# Patient Record
Sex: Male | Born: 1955 | Race: White | Hispanic: No | Smoking: Never smoker
Health system: Southern US, Community
[De-identification: ages and names within clinical notes are randomized; demographics above are authoritative.]

## PROBLEM LIST (undated history)

## (undated) DIAGNOSIS — I10 Essential (primary) hypertension: Secondary | ICD-10-CM

## (undated) DIAGNOSIS — C7931 Secondary malignant neoplasm of brain: Secondary | ICD-10-CM

## (undated) HISTORY — PX: BRAIN SURGERY: SHX531

---

## 2017-01-30 ENCOUNTER — Inpatient Hospital Stay (HOSPITAL_COMMUNITY)
Admission: EM | Admit: 2017-01-30 | Discharge: 2017-02-02 | DRG: 963 | Disposition: A | Discharge status: Home Health | Payer: BLUE CROSS/BLUE SHIELD | Attending: Surgery | Admitting: Surgery

## 2017-01-30 ENCOUNTER — Emergency Department (HOSPITAL_COMMUNITY): Payer: BLUE CROSS/BLUE SHIELD

## 2017-01-30 ENCOUNTER — Encounter (HOSPITAL_COMMUNITY): Payer: Self-pay | Admitting: *Deleted

## 2017-01-30 DIAGNOSIS — Z6841 Body Mass Index (BMI) 40.0 and over, adult: Secondary | ICD-10-CM

## 2017-01-30 DIAGNOSIS — R569 Unspecified convulsions: Secondary | ICD-10-CM

## 2017-01-30 DIAGNOSIS — Z85820 Personal history of malignant melanoma of skin: Secondary | ICD-10-CM

## 2017-01-30 DIAGNOSIS — G936 Cerebral edema: Secondary | ICD-10-CM | POA: Diagnosis not present

## 2017-01-30 DIAGNOSIS — S27329A Contusion of lung, unspecified, initial encounter: Secondary | ICD-10-CM | POA: Diagnosis present

## 2017-01-30 DIAGNOSIS — T1490XA Injury, unspecified, initial encounter: Secondary | ICD-10-CM

## 2017-01-30 DIAGNOSIS — S36899A Unspecified injury of other intra-abdominal organs, initial encounter: Secondary | ICD-10-CM | POA: Diagnosis present

## 2017-01-30 DIAGNOSIS — Y92410 Unspecified street and highway as the place of occurrence of the external cause: Secondary | ICD-10-CM

## 2017-01-30 DIAGNOSIS — C7931 Secondary malignant neoplasm of brain: Secondary | ICD-10-CM | POA: Diagnosis not present

## 2017-01-30 DIAGNOSIS — I10 Essential (primary) hypertension: Secondary | ICD-10-CM | POA: Diagnosis present

## 2017-01-30 DIAGNOSIS — S32010A Wedge compression fracture of first lumbar vertebra, initial encounter for closed fracture: Principal | ICD-10-CM

## 2017-01-30 DIAGNOSIS — Z79899 Other long term (current) drug therapy: Secondary | ICD-10-CM

## 2017-01-30 DIAGNOSIS — E669 Obesity, unspecified: Secondary | ICD-10-CM | POA: Diagnosis present

## 2017-01-30 DIAGNOSIS — S36892A Contusion of other intra-abdominal organs, initial encounter: Secondary | ICD-10-CM | POA: Diagnosis present

## 2017-01-30 DIAGNOSIS — M545 Low back pain: Secondary | ICD-10-CM | POA: Diagnosis not present

## 2017-01-30 HISTORY — DX: Essential (primary) hypertension: I10

## 2017-01-30 HISTORY — DX: Secondary malignant neoplasm of brain: C79.31

## 2017-01-30 LAB — CBC
HEMATOCRIT: 46.2 % (ref 39.0–52.0)
Hemoglobin: 15.7 g/dL (ref 13.0–17.0)
MCH: 28.5 pg (ref 26.0–34.0)
MCHC: 34 g/dL (ref 30.0–36.0)
MCV: 83.8 fL (ref 78.0–100.0)
Platelets: 242 10*3/uL (ref 150–400)
RBC: 5.51 MIL/uL (ref 4.22–5.81)
RDW: 13.6 % (ref 11.5–15.5)
WBC: 18.1 10*3/uL — AB (ref 4.0–10.5)

## 2017-01-30 LAB — I-STAT CHEM 8, ED
BUN: 19 mg/dL (ref 6–20)
CREATININE: 1.4 mg/dL — AB (ref 0.61–1.24)
Calcium, Ion: 1.11 mmol/L — ABNORMAL LOW (ref 1.15–1.40)
Chloride: 107 mmol/L (ref 101–111)
Glucose, Bld: 126 mg/dL — ABNORMAL HIGH (ref 65–99)
HEMATOCRIT: 47 % (ref 39.0–52.0)
HEMOGLOBIN: 16 g/dL (ref 13.0–17.0)
Potassium: 4.3 mmol/L (ref 3.5–5.1)
Sodium: 139 mmol/L (ref 135–145)
TCO2: 19 mmol/L (ref 0–100)

## 2017-01-30 LAB — CDS SEROLOGY

## 2017-01-30 LAB — PROTIME-INR
INR: 1.09
Prothrombin Time: 14.2 seconds (ref 11.4–15.2)

## 2017-01-30 LAB — COMPREHENSIVE METABOLIC PANEL
ALT: 50 U/L (ref 17–63)
AST: 57 U/L — AB (ref 15–41)
Albumin: 3.8 g/dL (ref 3.5–5.0)
Alkaline Phosphatase: 81 U/L (ref 38–126)
Anion gap: 13 (ref 5–15)
BUN: 16 mg/dL (ref 6–20)
CHLORIDE: 106 mmol/L (ref 101–111)
CO2: 17 mmol/L — ABNORMAL LOW (ref 22–32)
Calcium: 9 mg/dL (ref 8.9–10.3)
Creatinine, Ser: 1.52 mg/dL — ABNORMAL HIGH (ref 0.61–1.24)
GFR calc Af Amer: 56 mL/min — ABNORMAL LOW (ref >60)
GFR, EST NON AFRICAN AMERICAN: 48 mL/min — AB (ref >60)
Glucose, Bld: 129 mg/dL — ABNORMAL HIGH (ref 65–99)
POTASSIUM: 4.3 mmol/L (ref 3.5–5.1)
SODIUM: 136 mmol/L (ref 135–145)
Total Bilirubin: 0.7 mg/dL (ref 0.3–1.2)
Total Protein: 6.6 g/dL (ref 6.5–8.1)

## 2017-01-30 LAB — URINALYSIS, ROUTINE W REFLEX MICROSCOPIC
Bilirubin Urine: NEGATIVE
GLUCOSE, UA: NEGATIVE mg/dL
KETONES UR: NEGATIVE mg/dL
LEUKOCYTES UA: NEGATIVE
NITRITE: NEGATIVE
PROTEIN: 30 mg/dL — AB
Specific Gravity, Urine: 1.032 — ABNORMAL HIGH (ref 1.005–1.030)
pH: 5 (ref 5.0–8.0)

## 2017-01-30 LAB — I-STAT CG4 LACTIC ACID, ED: Lactic Acid, Venous: 6.59 mmol/L (ref 0.5–1.9)

## 2017-01-30 LAB — ETHANOL: Alcohol, Ethyl (B): <5 mg/dL (ref <5)

## 2017-01-30 MED ORDER — SODIUM CHLORIDE 0.9 % IV SOLN
500.0000 mg | Freq: Two times a day (BID) | INTRAVENOUS | Status: DC
  Administered 2017-01-31 – 2017-02-01 (×3): 500 mg via INTRAVENOUS
  Filled 2017-01-30 (×4): qty 5

## 2017-01-30 MED ORDER — OXYCODONE HCL 5 MG PO TABS: 5.0000 mg | ORAL_TABLET | ORAL | Status: DC | PRN

## 2017-01-30 MED ORDER — ONDANSETRON HCL 4 MG/2ML IJ SOLN: 4.0000 mg | Freq: Four times a day (QID) | INTRAMUSCULAR | Status: DC | PRN

## 2017-01-30 MED ORDER — METHOCARBAMOL 500 MG PO TABS
500.0000 mg | ORAL_TABLET | Freq: Four times a day (QID) | ORAL | Status: DC | PRN
  Administered 2017-02-01 – 2017-02-02 (×2): 500 mg via ORAL
  Filled 2017-01-30 (×3): qty 1

## 2017-01-30 MED ORDER — IOPAMIDOL (ISOVUE-300) INJECTION 61%
INTRAVENOUS | Status: AC
  Administered 2017-01-30: 75 mL via INTRAVENOUS
  Filled 2017-01-30: qty 75

## 2017-01-30 MED ORDER — ONDANSETRON HCL 4 MG PO TABS: 4.0000 mg | ORAL_TABLET | Freq: Four times a day (QID) | ORAL | Status: DC | PRN

## 2017-01-30 MED ORDER — OXYCODONE HCL 5 MG PO TABS
10.0000 mg | ORAL_TABLET | ORAL | Status: DC | PRN
  Administered 2017-01-31 – 2017-02-02 (×7): 10 mg via ORAL
  Filled 2017-01-30 (×7): qty 2

## 2017-01-30 MED ORDER — HYDROMORPHONE HCL 1 MG/ML IJ SOLN
1.0000 mg | Freq: Once | INTRAMUSCULAR | Status: AC
Start: 2017-01-30 — End: 2017-01-30
  Administered 2017-01-30: 1 mg via INTRAVENOUS
  Filled 2017-01-30 (×2): qty 1

## 2017-01-30 MED ORDER — LORAZEPAM 2 MG/ML IJ SOLN
INTRAMUSCULAR | Status: AC
  Filled 2017-01-30: qty 1

## 2017-01-30 MED ORDER — FENTANYL CITRATE (PF) 100 MCG/2ML IJ SOLN
50.0000 ug | Freq: Once | INTRAMUSCULAR | Status: AC
  Administered 2017-01-30: 50 ug via INTRAVENOUS

## 2017-01-30 MED ORDER — LORAZEPAM 2 MG/ML IJ SOLN
2.0000 mg | Freq: Once | INTRAMUSCULAR | Status: AC
  Administered 2017-01-30: 2 mg via INTRAVENOUS

## 2017-01-30 MED ORDER — DOCUSATE SODIUM 100 MG PO CAPS
100.0000 mg | ORAL_CAPSULE | Freq: Two times a day (BID) | ORAL | Status: DC
  Administered 2017-01-31 – 2017-02-02 (×5): 100 mg via ORAL
  Filled 2017-01-30 (×5): qty 1

## 2017-01-30 MED ORDER — BISACODYL 10 MG RE SUPP
10.0000 mg | Freq: Every day | RECTAL | Status: DC | PRN
Start: 2017-01-30 — End: 2017-02-02

## 2017-01-30 MED ORDER — SODIUM CHLORIDE 0.9 % IV SOLN
1000.0000 mg | Freq: Once | INTRAVENOUS | Status: AC
  Administered 2017-01-30: 1000 mg via INTRAVENOUS
  Filled 2017-01-30: qty 10

## 2017-01-30 MED ORDER — FENTANYL CITRATE (PF) 100 MCG/2ML IJ SOLN
INTRAMUSCULAR | Status: AC
  Administered 2017-01-30: 50 ug via INTRAVENOUS
  Filled 2017-01-30: qty 2

## 2017-01-30 MED ORDER — AMLODIPINE BESYLATE 5 MG PO TABS
5.0000 mg | ORAL_TABLET | Freq: Every day | ORAL | Status: DC
  Administered 2017-01-31 – 2017-02-02 (×3): 5 mg via ORAL
  Filled 2017-01-30 (×3): qty 1

## 2017-01-30 MED ORDER — FENTANYL CITRATE (PF) 100 MCG/2ML IJ SOLN
100.0000 ug | Freq: Once | INTRAMUSCULAR | Status: AC
  Administered 2017-01-30: 100 ug via INTRAVENOUS
  Filled 2017-01-30: qty 2

## 2017-01-30 MED ORDER — ACETAMINOPHEN 325 MG PO TABS: 650.0000 mg | ORAL_TABLET | ORAL | Status: DC | PRN

## 2017-01-30 MED ORDER — MORPHINE SULFATE (PF) 4 MG/ML IV SOLN
1.0000 mg | INTRAVENOUS | Status: DC | PRN
  Administered 2017-01-31 (×2): 2 mg via INTRAVENOUS
  Filled 2017-01-30 (×4): qty 1

## 2017-01-30 MED ORDER — SODIUM CHLORIDE 0.9 % IV SOLN
INTRAVENOUS | Status: DC
  Administered 2017-01-30: 22:00:00 via INTRAVENOUS
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
## 2017-01-30 NOTE — ED Notes (Signed)
Pt was able to use urinal.

## 2017-01-30 NOTE — ED Notes (Signed)
CT called and asked if pt could have more pain medicine. MD notified and gave verbal order for 17mcg of fentanyl.

## 2017-01-30 NOTE — ED Notes (Signed)
EDP explained admission plan to pt. And family .

## 2017-01-30 NOTE — ED Notes (Addendum)
Fentanyl IV given for back pain requested by family , pt. Is somnolent , C-  Collar intact , NRB mask 15 lpm/[place] , O2 sat = 98%. IV sites intact. Pt. waiting for admitting MD .
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
## 2017-01-30 NOTE — ED Notes (Signed)
Dr. Barry Dienes at bedside evaluating pt , explained tests results and plan of care to pt. and family .
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
## 2017-01-30 NOTE — ED Notes (Signed)
Pt transported to CT ?

## 2017-01-31 ENCOUNTER — Encounter (HOSPITAL_COMMUNITY): Payer: Self-pay

## 2017-01-31 ENCOUNTER — Other Ambulatory Visit: Payer: Self-pay | Admitting: Hematology and Oncology

## 2017-01-31 ENCOUNTER — Observation Stay (HOSPITAL_COMMUNITY): Payer: BLUE CROSS/BLUE SHIELD

## 2017-01-31 DIAGNOSIS — S32010A Wedge compression fracture of first lumbar vertebra, initial encounter for closed fracture: Secondary | ICD-10-CM | POA: Diagnosis present

## 2017-01-31 DIAGNOSIS — Z85820 Personal history of malignant melanoma of skin: Secondary | ICD-10-CM | POA: Diagnosis not present

## 2017-01-31 DIAGNOSIS — C439 Malignant melanoma of skin, unspecified: Secondary | ICD-10-CM

## 2017-01-31 DIAGNOSIS — T148XXA Other injury of unspecified body region, initial encounter: Secondary | ICD-10-CM

## 2017-01-31 DIAGNOSIS — M545 Low back pain: Secondary | ICD-10-CM | POA: Diagnosis present

## 2017-01-31 DIAGNOSIS — I10 Essential (primary) hypertension: Secondary | ICD-10-CM | POA: Diagnosis present

## 2017-01-31 DIAGNOSIS — E669 Obesity, unspecified: Secondary | ICD-10-CM | POA: Diagnosis present

## 2017-01-31 DIAGNOSIS — S36899A Unspecified injury of other intra-abdominal organs, initial encounter: Secondary | ICD-10-CM | POA: Diagnosis present

## 2017-01-31 DIAGNOSIS — G936 Cerebral edema: Secondary | ICD-10-CM

## 2017-01-31 DIAGNOSIS — N179 Acute kidney failure, unspecified: Secondary | ICD-10-CM

## 2017-01-31 DIAGNOSIS — Y92410 Unspecified street and highway as the place of occurrence of the external cause: Secondary | ICD-10-CM | POA: Diagnosis not present

## 2017-01-31 DIAGNOSIS — C7931 Secondary malignant neoplasm of brain: Secondary | ICD-10-CM | POA: Diagnosis present

## 2017-01-31 DIAGNOSIS — S27329A Contusion of lung, unspecified, initial encounter: Secondary | ICD-10-CM | POA: Diagnosis present

## 2017-01-31 DIAGNOSIS — R569 Unspecified convulsions: Secondary | ICD-10-CM | POA: Diagnosis present

## 2017-01-31 DIAGNOSIS — Z6841 Body Mass Index (BMI) 40.0 and over, adult: Secondary | ICD-10-CM | POA: Diagnosis not present

## 2017-01-31 DIAGNOSIS — Z79899 Other long term (current) drug therapy: Secondary | ICD-10-CM | POA: Diagnosis not present

## 2017-01-31 LAB — BASIC METABOLIC PANEL
ANION GAP: 11 (ref 5–15)
BUN: 16 mg/dL (ref 6–20)
CALCIUM: 8.8 mg/dL — AB (ref 8.9–10.3)
CO2: 22 mmol/L (ref 22–32)
CREATININE: 1.41 mg/dL — AB (ref 0.61–1.24)
Chloride: 104 mmol/L (ref 101–111)
GFR calc non Af Amer: 53 mL/min — ABNORMAL LOW (ref >60)
GLUCOSE: 132 mg/dL — AB (ref 65–99)
Potassium: 3.7 mmol/L (ref 3.5–5.1)
Sodium: 137 mmol/L (ref 135–145)

## 2017-01-31 LAB — CBC
HEMATOCRIT: 44.9 % (ref 39.0–52.0)
HEMOGLOBIN: 15 g/dL (ref 13.0–17.0)
MCH: 28 pg (ref 26.0–34.0)
MCHC: 33.4 g/dL (ref 30.0–36.0)
MCV: 83.9 fL (ref 78.0–100.0)
Platelets: 211 10*3/uL (ref 150–400)
RBC: 5.35 MIL/uL (ref 4.22–5.81)
RDW: 13.6 % (ref 11.5–15.5)
WBC: 10.2 10*3/uL (ref 4.0–10.5)

## 2017-01-31 LAB — SAMPLE TO BLOOD BANK

## 2017-01-31 LAB — HIV ANTIBODY (ROUTINE TESTING W REFLEX): HIV SCREEN 4TH GENERATION: NONREACTIVE

## 2017-01-31 LAB — MRSA PCR SCREENING: MRSA by PCR: NEGATIVE

## 2017-01-31 MED ORDER — DEXAMETHASONE SODIUM PHOSPHATE 4 MG/ML IJ SOLN: 4.0000 mg | Freq: Four times a day (QID) | INTRAMUSCULAR | Status: DC

## 2017-01-31 MED ORDER — DEXAMETHASONE SODIUM PHOSPHATE 4 MG/ML IJ SOLN
4.0000 mg | Freq: Two times a day (BID) | INTRAMUSCULAR | Status: DC
  Administered 2017-01-31 – 2017-02-01 (×2): 4 mg via INTRAVENOUS
  Filled 2017-01-31 (×2): qty 1

## 2017-01-31 MED ORDER — LEVETIRACETAM 500 MG PO TABS: 1000.0000 mg | ORAL_TABLET | Freq: Two times a day (BID) | ORAL | Status: DC

## 2017-01-31 MED ORDER — GADOBENATE DIMEGLUMINE 529 MG/ML IV SOLN
20.0000 mL | Freq: Once | INTRAVENOUS | Status: AC
  Administered 2017-01-31: 20 mL via INTRAVENOUS
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
## 2017-01-31 NOTE — Plan of Care (Signed)
Problem: Skin Integrity: Goal: Risk for impaired skin integrity will decrease Outcome: Progressing Pt is able to turn on his on and have encouraged pt to do so to keep skin protected
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
## 2017-01-31 NOTE — Progress Notes (Signed)
PT Cancellation Note  Patient Details Name: Edward Odonnell MRN: [id number] DOB: [birth year]   Cancelled Treatment:    Reason Eval/Treat Not Completed: Patient not medically ready; Noted pt with L1 fx awaiting neurosurgical evaluation.  Will follow up after recommendations for mobilization per neurosurgery.   Reginia Naas 01/31/2017, 10:45 AM  Magda Kiel, [place] 01/31/2017

## 2017-01-31 NOTE — Progress Notes (Signed)
Patient adamant about wanting something to drink.  MD paged and approved ice chips and a few sips of water.  Patient satisfied and tolerating well.  Will continue to monitor.

## 2017-02-01 LAB — CBC
HCT: 41.3 % (ref 39.0–52.0)
HEMOGLOBIN: 13.6 g/dL (ref 13.0–17.0)
MCH: 28 pg (ref 26.0–34.0)
MCHC: 32.9 g/dL (ref 30.0–36.0)
MCV: 85 fL (ref 78.0–100.0)
Platelets: 191 10*3/uL (ref 150–400)
RBC: 4.86 MIL/uL (ref 4.22–5.81)
RDW: 13.6 % (ref 11.5–15.5)
WBC: 11 10*3/uL — ABNORMAL HIGH (ref 4.0–10.5)

## 2017-02-01 MED ORDER — LEVETIRACETAM ER 500 MG PO TB24
500.0000 mg | ORAL_TABLET | Freq: Two times a day (BID) | ORAL | Status: DC
  Administered 2017-02-01 – 2017-02-02 (×2): 500 mg via ORAL
  Filled 2017-02-01 (×3): qty 1

## 2017-02-01 MED ORDER — DEXAMETHASONE 4 MG PO TABS
4.0000 mg | ORAL_TABLET | Freq: Two times a day (BID) | ORAL | Status: DC
  Administered 2017-02-01 – 2017-02-02 (×2): 4 mg via ORAL
  Filled 2017-02-01 (×3): qty 1

## 2017-02-01 MED ORDER — MORPHINE SULFATE (PF) 4 MG/ML IV SOLN: 1.0000 mg | INTRAVENOUS | Status: DC | PRN

## 2017-02-01 NOTE — Progress Notes (Signed)
OT Cancellation Note  Patient Details Name: Edward Odonnell MRN: [id number] DOB: 1956/05/14   Cancelled Treatment:    Reason Eval/Treat Not Completed: Medical issues which prohibited therapy. Awaiting neurosurgical consult for lumbar fx prior to mobilizing.  [name] 02/01/2017, 8:15 AM  [phone]
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
## 2017-02-01 NOTE — Progress Notes (Signed)
Patient currently on Keppra 500 mg twice a day with no further seizures. At this time he is on IV and may change to 500 mg twice a day by mouth intake and by mouth medications. MRI does confirm peripherally enhancing mass lesion in the anterior left frontal lobe. Oncology has consult dated and left a consult note. Agree with oncology to continue Keppra at discharge. As always with any seizure patient is not to drive, climb heights, or being any standing water for 6 months or until cleared by physician.  At this time neurology will sign off. If patient continues to have further seizures feel free to call us back and we can make adjustments with antiepileptic medications. Thank you very much for this consult.  Etta Quill PA-C [facility] [phone]  M-F  (8:30 am- 4 PM)  02/01/2017, 8:51 AM

## 2017-02-01 NOTE — Progress Notes (Signed)
Orthopedic Tech Progress Note Patient Details:  Eswin Worrell 04/21/1956 [id number] Brace completed by bio-tech Patient ID: Etheleen Sia, male   DOB: May 23, 1956, 61 y.o.   MRN: [id number]   Braulio Bosch 02/01/2017, 3:37 PM

## 2017-02-01 NOTE — Progress Notes (Signed)
Orthopedic Tech Progress Note Patient Details:  Edward Odonnell 12-Aug-1956 [id number]  Patient ID: Etheleen Sia, male   DOB: 1956-02-10, 61 y.o.   MRN: [id number]   Hildred Priest 02/01/2017, 12:44 PM Called in bio-tech brace order; spoke with Bella Kennedy
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
## 2017-02-02 MED ORDER — OXYCODONE HCL 5 MG PO TABS: 5.0000 mg | ORAL_TABLET | ORAL | 0 refills | Status: AC | PRN

## 2017-02-02 MED ORDER — DEXAMETHASONE 4 MG PO TABS: 4.0000 mg | ORAL_TABLET | Freq: Two times a day (BID) | ORAL | 0 refills | Status: AC

## 2017-02-02 MED ORDER — GI COCKTAIL ~~LOC~~
30.0000 mL | Freq: Two times a day (BID) | ORAL | Status: DC | PRN
  Administered 2017-02-02: 30 mL via ORAL
  Filled 2017-02-02: qty 30

## 2017-02-02 MED ORDER — DOCUSATE SODIUM 100 MG PO CAPS: 100.0000 mg | ORAL_CAPSULE | Freq: Two times a day (BID) | ORAL | 0 refills | Status: AC

## 2017-02-02 MED ORDER — LEVETIRACETAM ER 500 MG PO TB24: 500.0000 mg | ORAL_TABLET | Freq: Two times a day (BID) | ORAL | 0 refills | Status: AC
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
## 2017-02-02 NOTE — Discharge Instructions (Signed)
Wear back brace when out of bed Follow-up with oncologist/neurosurgeon at [facility] No driving for at least 6 months, or until cleared by your doctor Call medical records to get your files [phone])   Lumbar Fracture A lumbar fracture is a break in one of the bones of the lower back. Lumbar fractures range in severity. Severe fractures can damage the spinal cord. What are the causes? This condition may be caused by:  A fall (common).  A car accident (common).  A gunshot wound.  A hard, direct hit to the back.  Osteoporosis. What are the signs or symptoms? The main symptom of this condition is severe pain in the lower back. If a fracture is complex or severe, there may also be:  A misshapen or swollen area on the lower back.  A limited ability to move an area of the lower back.  An inability to empty the bladder or bowel.  A loss of strength or sensation in the legs, feet, and toes.  Paralysis. How is this diagnosed? This condition is diagnosed based on:  A physical exam.  Symptoms and what happened just before they developed.  The results of imaging tests, such as an X-ray, CT scan, or MRI. If your nerves have been damaged, you may also have other tests to find out how much damage there is. How is this treated? Treatment for this condition depends on the specifics of the injury. Most fractures can be treated with:  A back brace.  Bed rest and activity restrictions.  Pain medicine.  Physical therapy. Fractures that are complex, involve multiple bones, or make the spine unstable may require surgery to remove pressure from the nerves or spinal cord and to stabilize the broken pieces of bone. During recovery, it is normal to have pain and stiffness in the back for weeks. Follow these instructions at home: Medicines   Take medicines only as directed by your health care provider.  Do not drive or operate heavy machinery while taking pain  medicine. Activity   Stay in bed for as long as directed by your health care provider.  If you were shown how to do any exercises to improve motion and strength in your back, do them as directed by your health care provider.  Return to your normal activities as directed by your health care provider. Ask your health care provider what activities are safe for you. General instructions   If you were given a neck brace or back brace, wear it as directed by your health care provider.  Keep all follow-up visits as directed by your health care provider. This is important. Failure to follow-up as recommended could result in permanent injury, disability, and long-lasting (chronic) pain. Contact a health care provider if:  Your pain does not improve over time.  You have a persistent cough.  You cannot return to your normal activities as planned or expected. Get help right away if:  You have severe pain or your pain suddenly gets worse.  You are unable to move.  You have numbness, tingling, weakness, or paralysis in any part of your body.  You cannot control your bladder or bowel.  You have difficulty breathing.  You have a fever.  You have pain in your chest or abdomen.  You vomit. This information is not intended to replace advice given to you by your health care provider. Make sure you discuss any questions you have with your health care provider. Document Released: 12/28/2006 Document Revised: 02/18/2016 Document  Reviewed: 09/08/2014 Elsevier Interactive Patient Education  2017 [employer].  Seizure, Adult A seizure is a sudden burst of abnormal electrical activity in the brain. The abnormal activity temporarily interrupts normal brain function, causing a person to experience any of the following:  Involuntary movements.  Changes in awareness or consciousness.  Uncontrollable shaking (convulsions). Seizures usually last from 30 seconds to 2 minutes. They usually do not  cause permanent brain damage unless they are prolonged. What can cause a seizure to happen?  Seizures can happen for many reasons including:  A fever.  Low blood sugar.  A medicine.  An illnesses.  A brain injury. Some people who have a seizure never have another one. People who have repeated seizures have a condition called epilepsy. What are the symptoms of a seizure?  Symptoms of a seizure vary greatly from person to person. They include:  Convulsions.  Stiffening of the body.  Involuntary movements of the arms or legs.  Loss of consciousness.  Breathing problems.  Falling suddenly.  Confusion.  Head nodding.  Eye blinking or fluttering.  Lip smacking.  Drooling.  Rapid eye movements.  Grunting.  Loss of bladder control and bowel control.  Staring.  Unresponsiveness. Some people have symptoms right before a seizure happens (aura) and right after a seizure happens. Symptoms of an aura include:  Fear or anxiety.  Nausea.  Feeling like the room is spinning (vertigo).  A feeling of having seen or heard something before (deja vu).  Odd tastes or smells.  Changes in vision, such as seeing flashing lights or spots. Symptoms that may follow a seizure include:  Confusion.  Sleepiness.  Headache.  Weakness of one side of the body. Follow these instructions at home: Medicines    Take over-the-counter and prescription medicines only as told by your health care provider.  Avoid any substances that may prevent your medicine from working properly, such as alcohol. Activity   Do not drive, swim, or do any other activities that would be dangerous if you had another seizure. Wait until your health care provider approves.  If you live in the [place] check with your local DMV (department of motor vehicles) to find out about the local driving laws. Each state has specific rules about when you can legally return to driving.  Get enough rest. Lack of sleep  can make seizures more likely to occur. Educating others  Teach friends and family what to do if you have a seizure. They should:  Lay you on the ground to prevent a fall.  Cushion your head and body.  Loosen any tight clothing around your neck.  Turn you on your side. If vomiting occurs, this helps keep your airway clear.  Stay with you until you recover.  Not hold you down. Holding you down will not stop the seizure.  Not put anything in your mouth.  Know whether or not you need emergency care. General instructions   Contact your health care provider each time you have a seizure.  Avoid anything that has ever triggered a seizure for you.  Keep a seizure diary. Record what you remember about each seizure, especially anything that might have triggered the seizure.  Keep all follow-up visits as told by your health care provider. This is important. Contact a health care provider if:  You have another seizure.  You have seizures more often.  Your seizure symptoms change.  You continue to have seizures with treatment.  You have symptoms of an infection or  illness. They might increase your risk of having a seizure. Get help right away if:  You have a seizure:  That lasts longer than 5 minutes.  That is different than previous seizures.  That leaves you unable to speak or use a part of your body.  That makes it harder to breathe.  After a head injury.  You have:  Multiple seizures in a row.  Confusion or a severe headache right after a seizure.  You are having seizures more often.  You do not wake up immediately after a seizure.  You injure yourself during a seizure. These symptoms may represent a serious problem that is an emergency. Do not wait to see if the symptoms will go away. Get medical help right away. Call your local emergency services (911 in the [place] Do not drive yourself to the hospital. This information is not intended to replace advice given  to you by your health care provider. Make sure you discuss any questions you have with your health care provider. Document Released: 09/09/2000 Document Revised: 05/08/2016 Document Reviewed: 04/15/2016 Elsevier Interactive Patient Education  2017 [employer].

## 2017-02-02 NOTE — Plan of Care (Signed)
Problem: Activity: Goal: Risk for activity intolerance will decrease Outcome: Progressing Educated and assisted patient with exiting bed using techniques per PT
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
## 2017-02-02 NOTE — Progress Notes (Signed)
Explained and discussed discharged instructions, follow up appt. Prescriptions given to wife. 3 in 1 beside commode and rolling walker sent home with pt. No complaints at this time. Pt going home with wife and belongings.

## 2017-02-03 ENCOUNTER — Telehealth (HOSPITAL_COMMUNITY): Payer: Self-pay
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
## 2017-02-03 NOTE — Telephone Encounter (Signed)
[phone] wife called saying home care can not come until Monday 5/14 and she can not help him. Very upset! She needs to know what to do.
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
